# Patient Record
Sex: Female | Born: 1965 | Race: White | Hispanic: No | Marital: Single
Health system: Southern US, Community
[De-identification: ages and names within clinical notes are randomized; demographics above are authoritative.]

---

## 1999-11-04 ENCOUNTER — Emergency Department (HOSPITAL_COMMUNITY): Admission: EM | Admit: 1999-11-04 | Discharge: 1999-11-04 | Payer: Self-pay | Admitting: *Deleted

## 2001-12-04 ENCOUNTER — Encounter: Payer: Self-pay | Admitting: *Deleted

## 2001-12-04 ENCOUNTER — Emergency Department (HOSPITAL_COMMUNITY): Admission: EM | Admit: 2001-12-04 | Discharge: 2001-12-04 | Payer: Self-pay | Admitting: *Deleted

## 2002-03-27 ENCOUNTER — Emergency Department (HOSPITAL_COMMUNITY): Admission: EM | Admit: 2002-03-27 | Discharge: 2002-03-27 | Payer: Self-pay | Admitting: Emergency Medicine

## 2002-03-27 ENCOUNTER — Encounter: Payer: Self-pay | Admitting: Emergency Medicine

## 2007-12-05 ENCOUNTER — Emergency Department (HOSPITAL_COMMUNITY): Admission: EM | Admit: 2007-12-05 | Discharge: 2007-12-05 | Payer: Self-pay | Admitting: Emergency Medicine

## 2009-01-13 ENCOUNTER — Emergency Department (HOSPITAL_BASED_OUTPATIENT_CLINIC_OR_DEPARTMENT_OTHER): Admission: EM | Admit: 2009-01-13 | Discharge: 2009-01-13 | Payer: Self-pay | Admitting: Emergency Medicine

## 2009-01-13 ENCOUNTER — Ambulatory Visit: Payer: Self-pay | Admitting: Diagnostic Radiology

## 2010-06-17 LAB — COMPREHENSIVE METABOLIC PANEL
ALT: 27 U/L (ref 0–35)
AST: 37 U/L (ref 0–37)
Albumin: 3.8 g/dL (ref 3.5–5.2)
Alkaline Phosphatase: 148 U/L — ABNORMAL HIGH (ref 39–117)
GFR calc Af Amer: >60 mL/min (ref >60)
Potassium: 3.6 mEq/L (ref 3.5–5.1)
Sodium: 136 mEq/L (ref 135–145)
Total Protein: 7.2 g/dL (ref 6.0–8.3)

## 2010-06-17 LAB — URINALYSIS, ROUTINE W REFLEX MICROSCOPIC
Bilirubin Urine: NEGATIVE
Glucose, UA: NEGATIVE mg/dL
Ketones, ur: NEGATIVE mg/dL
Nitrite: POSITIVE — AB
Protein, ur: 100 mg/dL — AB
Specific Gravity, Urine: 1.012 (ref 1.005–1.030)
Urobilinogen, UA: 1 mg/dL (ref 0.0–1.0)
pH: 6 (ref 5.0–8.0)

## 2010-06-17 LAB — URINE MICROSCOPIC-ADD ON

## 2010-06-17 LAB — WET PREP, GENITAL
Trich, Wet Prep: NONE SEEN
Yeast Wet Prep HPF POC: NONE SEEN

## 2010-06-17 LAB — CBC
Hemoglobin: 12.2 g/dL (ref 12.0–15.0)
Platelets: 264 10*3/uL (ref 150–400)
RDW: 12.6 % (ref 11.5–15.5)

## 2010-06-17 LAB — DIFFERENTIAL
Basophils Relative: 0 % (ref 0–1)
Eosinophils Absolute: 0 10*3/uL (ref 0.0–0.7)
Eosinophils Relative: 0 % (ref 0–5)
Monocytes Absolute: 1.1 10*3/uL — ABNORMAL HIGH (ref 0.1–1.0)
Neutro Abs: 16.9 10*3/uL — ABNORMAL HIGH (ref 1.7–7.7)

## 2010-06-17 LAB — URINE CULTURE

## 2010-06-17 LAB — GC/CHLAMYDIA PROBE AMP, GENITAL: GC Probe Amp, Genital: NEGATIVE

## 2010-08-20 ENCOUNTER — Other Ambulatory Visit: Payer: Self-pay | Admitting: Family Medicine

## 2010-08-20 DIAGNOSIS — Z1231 Encounter for screening mammogram for malignant neoplasm of breast: Secondary | ICD-10-CM

## 2010-08-26 ENCOUNTER — Ambulatory Visit
Admission: RE | Admit: 2010-08-26 | Discharge: 2010-08-26 | Disposition: A | Discharge status: Home / Self Care | Payer: BC Managed Care – PPO | Source: Ambulatory Visit | Attending: Family Medicine | Admitting: Family Medicine

## 2010-08-26 DIAGNOSIS — Z1231 Encounter for screening mammogram for malignant neoplasm of breast: Secondary | ICD-10-CM

## 2010-11-27 IMAGING — CT CT ABDOMEN W/O CM
2 of 3 series · 16 of 46 positions shown, 18 images · non-contrast
Comparison: None

CT ABDOMEN

CLINICAL DATA: *Pain Flank pain;

CT ABDOMEN AND PELVIS
TECHNIQUE: Multidetector CT imaging of the abdomen was performed
following the standard protocol without IV contrast.,Technique:
Multidetector CT imaging of the pelvis was performed following the
standard protocol without intravenous contrast.

[Series 2: renal stone > 200 lbs 5.0 b31f · axial · 0.81mm/px · z∈[+750,+1140]mm · 13 of 90 slices shown, 15 images]
[im 6/90  soft-tissue]
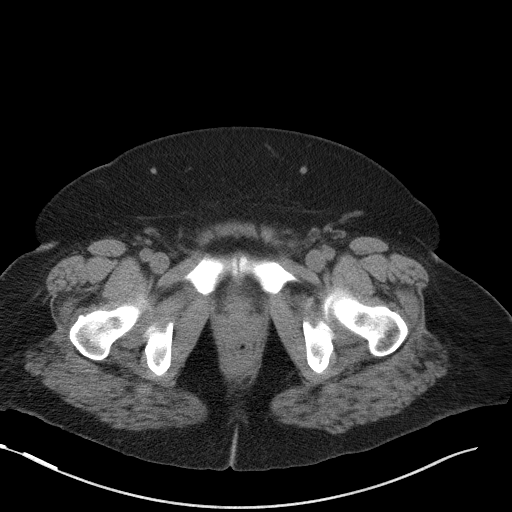
[im 6/90  bone]
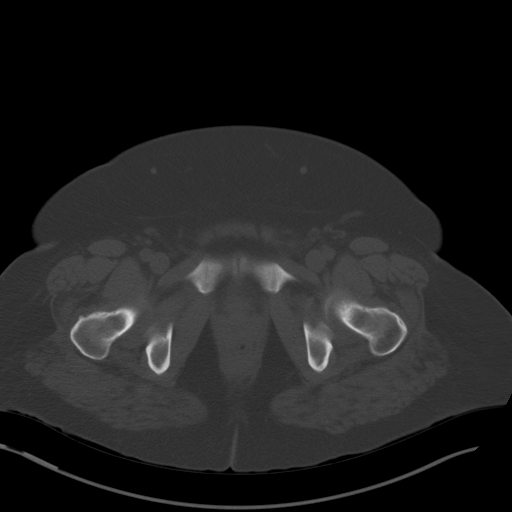
[im 12/90  soft-tissue]
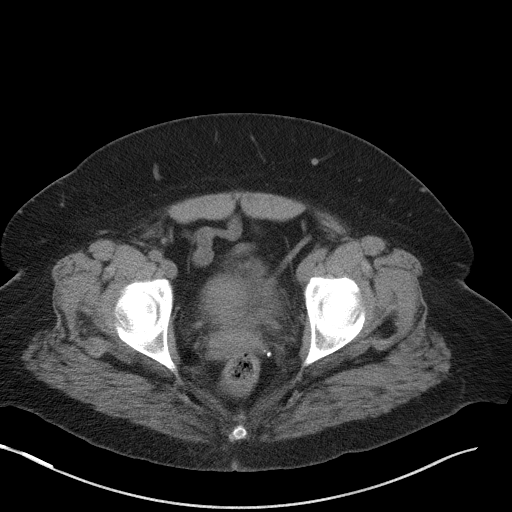
[im 18/90  soft-tissue]
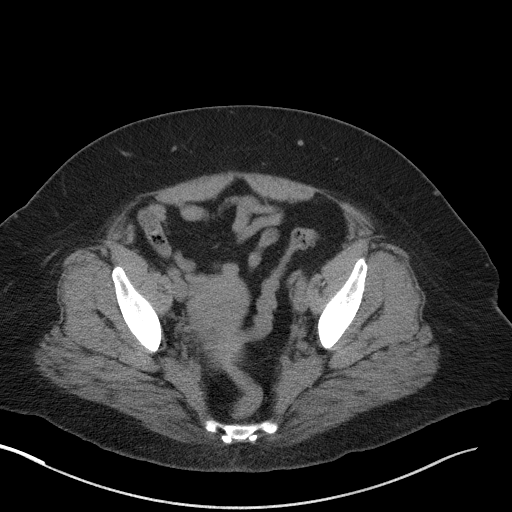
[im 26/90  soft-tissue]
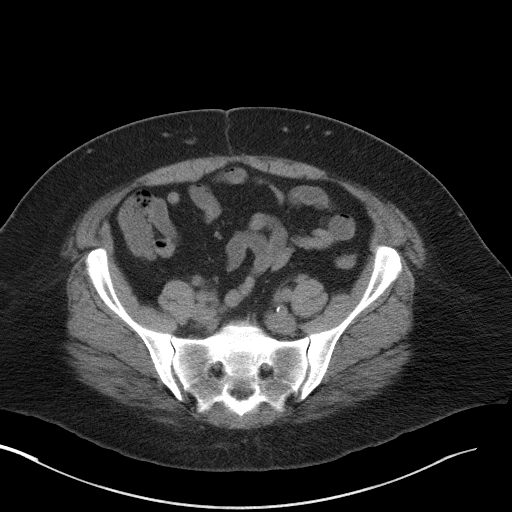
[im 32/90  soft-tissue]
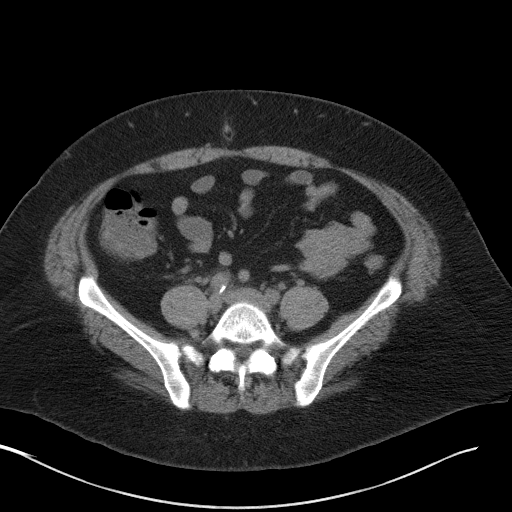
[im 38/90  soft-tissue]
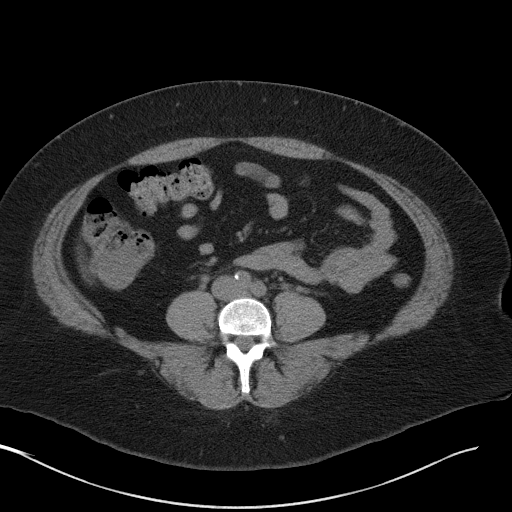
[im 46/90  soft-tissue]
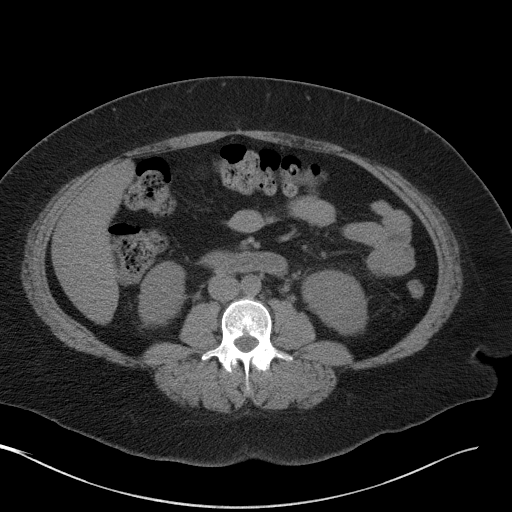
[im 52/90  soft-tissue]
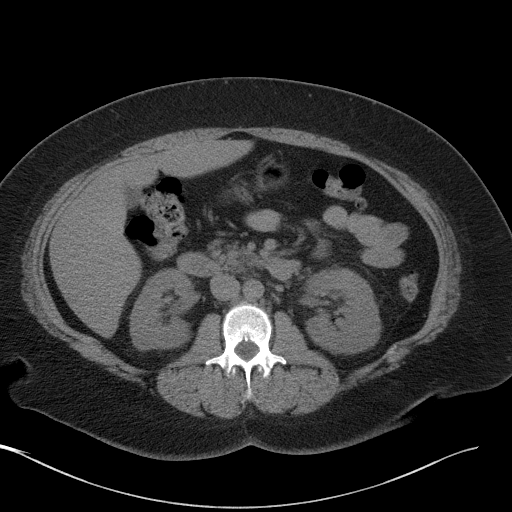
[im 58/90  soft-tissue]
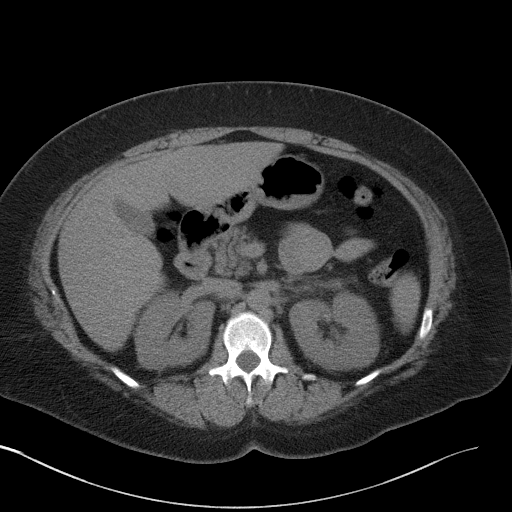
[im 58/90  bone]
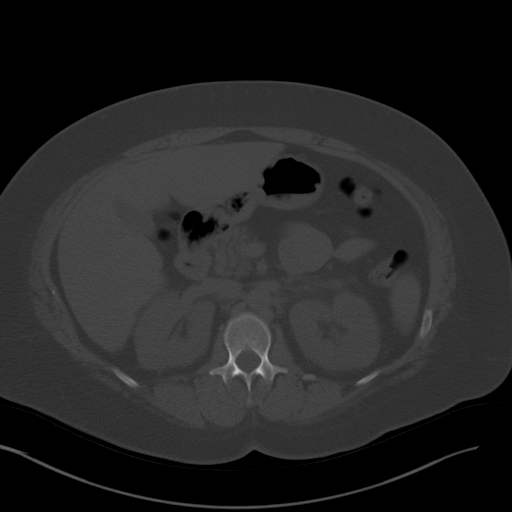
[im 64/90  soft-tissue]
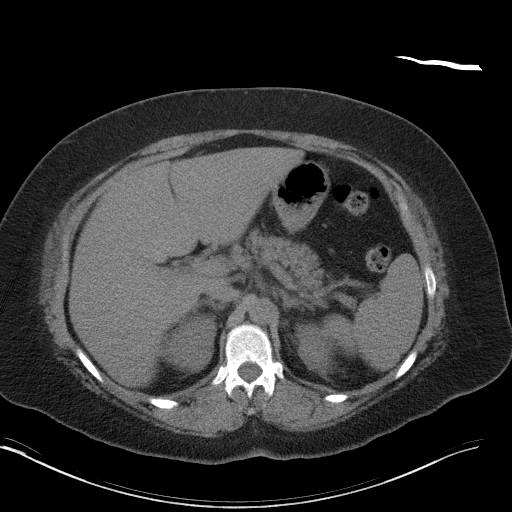
[im 72/90  soft-tissue]
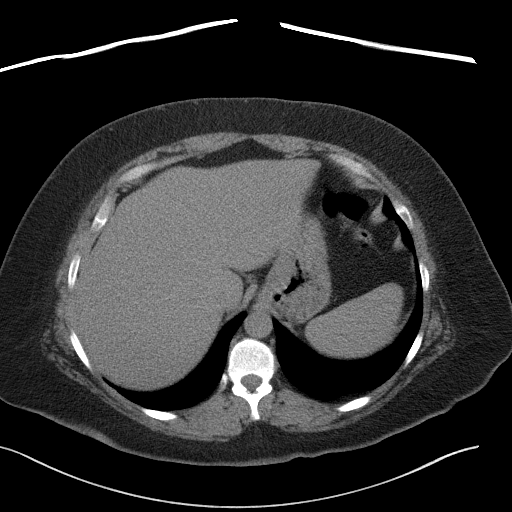
[im 78/90  soft-tissue]
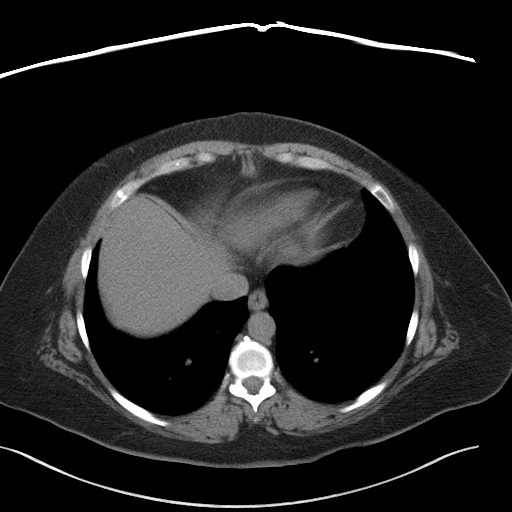
[im 84/90  soft-tissue]
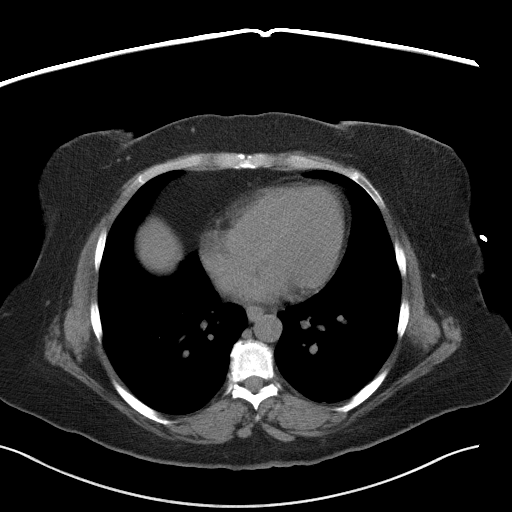

[Series 5: renal stone 3.0 coronal · coronal · 0.80mm/px · 3 of 106 slices shown]
[im 36/106  soft-tissue]
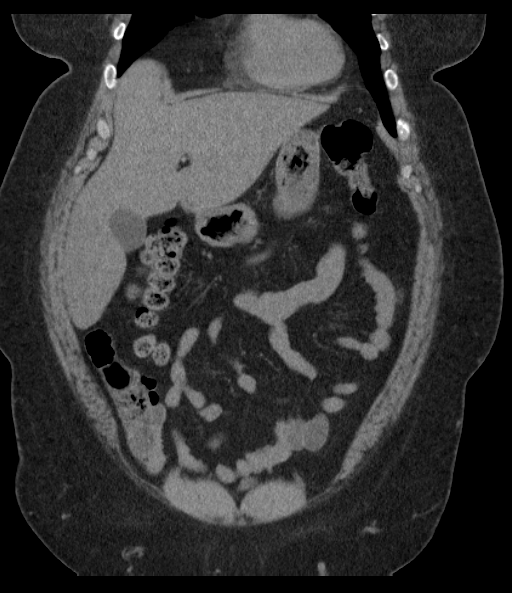
[im 47/106  soft-tissue]
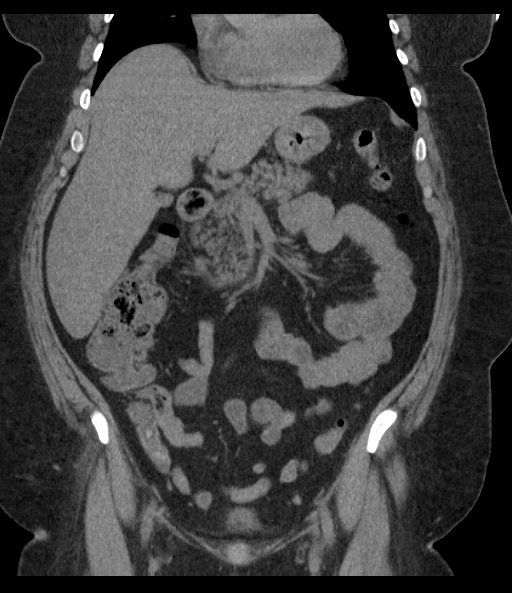
[im 59/106  soft-tissue]
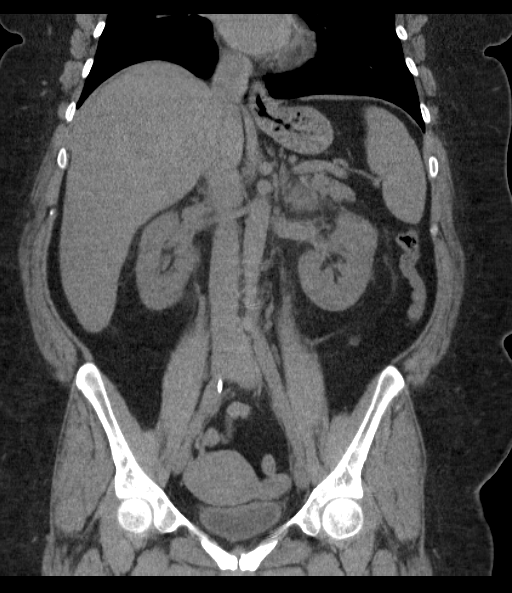

[16 of 46 positions shown; findings below may reference images not displayed]

FINDINGS: Lung bases are unremarkable.  Unenhanced liver, spleen,
pancreas and adrenals are unremarkable.  The no calcified
gallstones are noted within gallbladder.  Unenhanced kidney are
symmetrical in size.  There is mild left perinephric and
periureteral stranding.  No ureteral calcifications are noted
bilaterally.  Mild atherosclerotic calcifications of the iliac
arteries are noted.

No bowel obstruction.  No ascites or free air.  No aortic aneurysm.
No adenopathy.

No destructive bony lesions are noted.
IMPRESSION: 1.  Mild left perinephric stranding.  No nephrolithiasis.
Bilateral no ureteral calcifications.
2.  No bowel obstruction.

CT PELVIS
FINDINGS: Bilateral no distal ureteral calcifications are noted.
Minimal left periureteral stranding is noted.  Findings may be due
to a recent passed urinary calculus.  Clinical correlation is
necessary.  No bladder calcifications are noted.

No pericecal inflammation.  Normal appendix is clearly visualized.
No pelvic ascites or adenopathy.
IMPRESSION: 1.  Minimal left periureteral stranding is noted.  Findings may be
due to a recent passed the urinary left ureteral calculus.
Clinical correlation is necessary.
2.  Unremarkable urinary bladder.
3.  No pelvic ascites or adenopathy.

## 2011-04-26 ENCOUNTER — Other Ambulatory Visit (HOSPITAL_COMMUNITY): Payer: Self-pay | Admitting: Family Medicine

## 2011-04-26 ENCOUNTER — Other Ambulatory Visit (HOSPITAL_COMMUNITY): Payer: Self-pay | Admitting: Adult Health Nurse Practitioner

## 2011-04-26 ENCOUNTER — Ambulatory Visit (HOSPITAL_COMMUNITY)
Admission: RE | Admit: 2011-04-26 | Discharge: 2011-04-26 | Disposition: A | Discharge status: Home / Self Care | Payer: BC Managed Care – PPO | Source: Ambulatory Visit | Attending: Family Medicine | Admitting: Family Medicine

## 2011-04-26 DIAGNOSIS — R52 Pain, unspecified: Secondary | ICD-10-CM

## 2011-04-26 DIAGNOSIS — M545 Low back pain, unspecified: Secondary | ICD-10-CM | POA: Insufficient documentation

## 2011-04-26 DIAGNOSIS — M25519 Pain in unspecified shoulder: Secondary | ICD-10-CM | POA: Insufficient documentation

## 2011-04-26 DIAGNOSIS — M25559 Pain in unspecified hip: Secondary | ICD-10-CM | POA: Insufficient documentation

## 2011-04-26 DIAGNOSIS — M8530 Osteitis condensans, unspecified site: Secondary | ICD-10-CM | POA: Insufficient documentation

## 2018-02-20 ENCOUNTER — Telehealth (INDEPENDENT_AMBULATORY_CARE_PROVIDER_SITE_OTHER): Payer: Self-pay | Admitting: Specialist

## 2018-02-20 NOTE — Telephone Encounter (Signed)
[facility]. I called back [phone] advised processed by [facility] and gave number for them to contact [facility]
